# Patient Record
Sex: Female | Born: 1985 | Race: Black or African American | Hispanic: No | Marital: Married | Smoking: Current every day smoker
Health system: Southern US, Community
[De-identification: ages and names within clinical notes are randomized; demographics above are authoritative.]

---

## 2016-09-23 ENCOUNTER — Encounter (HOSPITAL_COMMUNITY): Payer: Self-pay | Admitting: *Deleted

## 2016-09-23 DIAGNOSIS — F17200 Nicotine dependence, unspecified, uncomplicated: Secondary | ICD-10-CM | POA: Insufficient documentation

## 2016-09-23 DIAGNOSIS — M791 Myalgia: Secondary | ICD-10-CM | POA: Insufficient documentation

## 2016-09-23 DIAGNOSIS — M25552 Pain in left hip: Secondary | ICD-10-CM | POA: Insufficient documentation

## 2016-09-23 DIAGNOSIS — R0789 Other chest pain: Secondary | ICD-10-CM | POA: Insufficient documentation

## 2016-09-23 NOTE — ED Triage Notes (Signed)
The pt is c/o pain in her entire lt side of her body for approx one-two weeks  She reports that it comes and goes  Worse in her lt hip and the pain shoots down to her lt toes  lmp this month

## 2016-09-24 ENCOUNTER — Emergency Department (HOSPITAL_COMMUNITY)
Admission: EM | Admit: 2016-09-24 | Discharge: 2016-09-24 | Disposition: A | Discharge status: Home / Self Care | Payer: Self-pay | Attending: Emergency Medicine | Admitting: Emergency Medicine

## 2016-09-24 ENCOUNTER — Emergency Department (HOSPITAL_COMMUNITY): Payer: Self-pay

## 2016-09-24 DIAGNOSIS — M791 Myalgia, unspecified site: Secondary | ICD-10-CM

## 2016-09-24 DIAGNOSIS — M25552 Pain in left hip: Secondary | ICD-10-CM

## 2016-09-24 DIAGNOSIS — R0789 Other chest pain: Secondary | ICD-10-CM

## 2016-09-24 LAB — CBC WITH DIFFERENTIAL/PLATELET
BASOS ABS: 0 10*3/uL (ref 0.0–0.1)
BASOS PCT: 0 %
EOS ABS: 0.1 10*3/uL (ref 0.0–0.7)
Eosinophils Relative: 1 %
HEMATOCRIT: 37.3 % (ref 36.0–46.0)
HEMOGLOBIN: 12.4 g/dL (ref 12.0–15.0)
Lymphocytes Relative: 20 %
Lymphs Abs: 2.7 10*3/uL (ref 0.7–4.0)
MCH: 27.7 pg (ref 26.0–34.0)
MCHC: 33.2 g/dL (ref 30.0–36.0)
MCV: 83.3 fL (ref 78.0–100.0)
MONOS PCT: 4 %
Monocytes Absolute: 0.5 10*3/uL (ref 0.1–1.0)
NEUTROS ABS: 9.9 10*3/uL — AB (ref 1.7–7.7)
NEUTROS PCT: 75 %
Platelets: 280 10*3/uL (ref 150–400)
RBC: 4.48 MIL/uL (ref 3.87–5.11)
RDW: 13.6 % (ref 11.5–15.5)
WBC: 13.2 10*3/uL — ABNORMAL HIGH (ref 4.0–10.5)

## 2016-09-24 LAB — URINALYSIS, ROUTINE W REFLEX MICROSCOPIC
Glucose, UA: NEGATIVE mg/dL
KETONES UR: 40 mg/dL — AB
LEUKOCYTES UA: NEGATIVE
NITRITE: NEGATIVE
PH: 5.5 (ref 5.0–8.0)
Protein, ur: 30 mg/dL — AB
Specific Gravity, Urine: 1.03 (ref 1.005–1.030)

## 2016-09-24 LAB — URINE MICROSCOPIC-ADD ON

## 2016-09-24 LAB — BASIC METABOLIC PANEL
ANION GAP: 11 (ref 5–15)
BUN: 5 mg/dL — ABNORMAL LOW (ref 6–20)
CALCIUM: 9.2 mg/dL (ref 8.9–10.3)
CO2: 23 mmol/L (ref 22–32)
CREATININE: 0.93 mg/dL (ref 0.44–1.00)
Chloride: 104 mmol/L (ref 101–111)
Glucose, Bld: 90 mg/dL (ref 65–99)
Potassium: 3.5 mmol/L (ref 3.5–5.1)
SODIUM: 138 mmol/L (ref 135–145)

## 2016-09-24 LAB — CK: CK TOTAL: 207 U/L (ref 38–234)

## 2016-09-24 LAB — POC URINE PREG, ED: PREG TEST UR: NEGATIVE

## 2016-09-24 MED ORDER — IBUPROFEN 600 MG PO TABS: 600.0000 mg | ORAL_TABLET | Freq: Four times a day (QID) | ORAL | 0 refills | Status: AC | PRN

## 2016-09-24 MED ORDER — IBUPROFEN 400 MG PO TABS
600.0000 mg | ORAL_TABLET | Freq: Once | ORAL | Status: AC
  Administered 2016-09-24: 600 mg via ORAL
  Filled 2016-09-24: qty 1

## 2016-09-24 NOTE — ED Notes (Signed)
Pt aware of need for urine sample. Pt unable to void at this time.  

## 2016-09-24 NOTE — ED Notes (Signed)
Urine sample located in mini-lab and sent down for urinalysis.

## 2016-09-24 NOTE — ED Provider Notes (Signed)
[facility]-EMERGENCY DEPT Provider Note   CSN: [id number] Arrival date & time: 09/23/16  2312  By signing my name below, I, Kayla Kim, attest that this documentation has been prepared under the direction and in the presence of Zadie Rhineonald Corlis Angelica, MD. Electronically Signed: Ether GriffinsKayla Kim, ED Scribe. 09/24/16. 2:29 AM.    History   Chief Complaint Chief Complaint  Patient presents with  . Generalized Body Aches     The history is provided by the patient. No language interpreter was used.  Illness  This is a new problem. The current episode started more than 1 week ago. The problem occurs constantly. The problem has not changed since onset.Associated symptoms include chest pain and abdominal pain. Pertinent negatives include no headaches and no shortness of breath. The symptoms are aggravated by walking and standing. Nothing relieves the symptoms. She has tried nothing for the symptoms. The treatment provided no relief.    HPI Comments: Kayla Kim is a 30 y.o. female otherwise healthy who presents to the Emergency Department complaining of constant shooting, aching left-sided body pain that started 1.5 weeks ago. Pt states that the pain is worst in her left hip and shoots down to her toes. Pt denies recent falls, trauma or injury. No recent long travel or surgery. Pt states pain worsens with prolonged periods in one position and with weight bearing. She also c/o left sided neck pain which is worsened by arm movement, left sided chest pains, lower back pain and abdominal pain. Pt denies fever, vomiting, headache, vision loss.   PMH - none Denies recent travel OB History    No data available       Home Medications    Prior to Admission medications   Not on File    Family History No family history on file.  Social History Social History  Substance Use Topics  . Smoking status: Current Every Day Smoker  . Smokeless tobacco: Never Used  . Alcohol use Yes      Allergies   Flagyl [metronidazole]   Review of Systems Review of Systems  Constitutional: Negative for fever.  Eyes: Negative for visual disturbance.  Respiratory: Negative for shortness of breath.        Denies hemoptysis   Cardiovascular: Positive for chest pain.  Gastrointestinal: Positive for abdominal pain. Negative for vomiting.  Musculoskeletal: Positive for arthralgias, back pain and myalgias.  Neurological: Negative for headaches.  All other systems reviewed and are negative.    Physical Exam Updated Vital Signs BP 108/73 (BP Location: Right Arm)   Pulse 91   Temp 97.9 F (36.6 C) (Oral)   Resp 23   Ht 5\' 1"  (1.549 m)   Wt 125 lb (56.7 kg)   LMP 09/15/2016   SpO2 100%   BMI 23.62 kg/m   Physical Exam  CONSTITUTIONAL: Well developed/well nourished HEAD: Normocephalic/atraumatic EYES: EOMI/PERRL ENMT: Mucous membranes moist NECK: supple no meningeal signs SPINE/BACK:entire spine nontender CV: S1/S2 noted, no murmurs/rubs/gallops noted, Chest - diffuse left side of chest wall tenderness, no crepitus noted LUNGS: Lungs are clear to auscultation bilaterally, no apparent distress ABDOMEN: soft, nontender, no rebound or guarding, bowel sounds noted throughout abdomen GU:no cva tenderness NEURO: Pt is awake/alert/appropriate, moves all extremitiesx4.  No facial droop.  No arm or leg drift, no ataxia noted.  Antalgic gait noted.    EXTREMITIES: pulses normal/equal, full ROM, tenderness to range of motion of left hip, no deformities noted.  No LE edema or erythema.  Distal pulses equal/intact.   SKIN:  warm, color normal PSYCH: no abnormalities of mood noted, alert and oriented to situation ED Treatments / Results  DIAGNOSTIC STUDIES: Oxygen Saturation is 100% on RA, normal by my interpretation.    COORDINATION OF CARE: 2:29 AM Discussed treatment plan with pt at bedside and pt agreed to plan.  Labs (all labs ordered are listed, but only abnormal  results are displayed) Labs Reviewed  BASIC METABOLIC PANEL - Abnormal; Notable for the following:       Result Value   BUN 5 (*)    All other components within normal limits  CBC WITH DIFFERENTIAL/PLATELET - Abnormal; Notable for the following:    WBC 13.2 (*)    Neutro Abs 9.9 (*)    All other components within normal limits  URINALYSIS, ROUTINE W REFLEX MICROSCOPIC ([facility]) - Abnormal; Notable for the following:    Color, Urine AMBER (*)    APPearance CLOUDY (*)    Hgb urine dipstick TRACE (*)    Bilirubin Urine SMALL (*)    Ketones, ur 40 (*)    Protein, ur 30 (*)    All other components within normal limits  URINE MICROSCOPIC-ADD ON - Abnormal; Notable for the following:    Squamous Epithelial / LPF 6-30 (*)    Bacteria, UA RARE (*)    All other components within normal limits  CK  POC URINE PREG, ED    EKG  EKG Interpretation  Date/Time:  Saturday September 23 2016 23:25:22 EST Ventricular Rate:  106 PR Interval:  120 QRS Duration: 86 QT Interval:  304 QTC Calculation: 403 R Axis:   81 Text Interpretation:  Sinus tachycardia Biatrial enlargement T wave abnormality, consider inferior ischemia Abnormal ECG No previous ECGs available Confirmed by Bebe ShaggyWICKLINE  MD, Aura Bibby ([id number]) on 09/24/2016 1:33:11 AM       Radiology Dg Chest 2 View  Result Date: 09/24/2016 CLINICAL DATA:  Left-sided body pain intermittent over the past 2 weeks. EXAM: CHEST  2 VIEW COMPARISON:  None. FINDINGS: The lungs are clear. The pulmonary vasculature is normal. Heart size is normal. Hilar and mediastinal contours are unremarkable. There is no pleural effusion. IMPRESSION: No active cardiopulmonary disease. Electronically Signed   By: Ellery Plunkaniel R Mitchell M.D.   On: 09/24/2016 04:21   Dg Hip Unilat W Or Wo Pelvis 2-3 Views Left  Result Date: 09/24/2016 CLINICAL DATA:  Left hip and leg pain EXAM: DG HIP (WITH OR WITHOUT PELVIS) 2-3V LEFT COMPARISON:  None. FINDINGS: There is no evidence of hip  fracture or dislocation. There is no evidence of arthropathy or other focal bone abnormality. IMPRESSION: Negative. Electronically Signed   By: Ellery Plunkaniel R Mitchell M.D.   On: 09/24/2016 04:21    Procedures Procedures (including critical care time)  Medications Ordered in ED Medications  ibuprofen (ADVIL,MOTRIN) tablet 600 mg (600 mg Oral Given 09/24/16 0245)     Initial Impression / Assessment and Plan / ED Course  I have reviewed the triage vital signs and the nursing notes.  Pertinent labs & imaging results that were available during my care of the patient were reviewed by me and considered in my medical decision making (see chart for details).  Clinical Course     Pt here with multiple complaints, she reported pain started in left hip region and now moved into upper body On exam, most of her pain focused on palpation of chest wall and left hip Imaging negative Pt ambulatory She did have unusual appearing EKG, though no old to compare.  She had no hypoxia/SOB or significant tachycardia to suggest PE (she also had reproducible CP) Otherwise she slept in the ER for hours without any acute issue I feel she is appropriate for d/c home Will start NSAIDs She will need PCP evaluation   Final Clinical Impressions(s) / ED Diagnoses   Final diagnoses:  Myalgia  Chest wall pain  Left hip pain  Pain of left hip joint    New Prescriptions New Prescriptions   IBUPROFEN (ADVIL,MOTRIN) 600 MG TABLET    Take 1 tablet (600 mg total) by mouth every 6 (six) hours as needed.    I personally performed the services described in this documentation, which was scribed in my presence. The recorded information has been reviewed and is accurate.        Zadie Rhine, MD 09/24/16 [phone]

## 2017-12-22 IMAGING — CR DG CHEST 2V
2 series · 2 of 2 positions shown · non-contrast
Comparison: None.

CLINICAL DATA: Left-sided body pain intermittent over the past 2
weeks.

EXAM:
CHEST  2 VIEW

[chest lat]
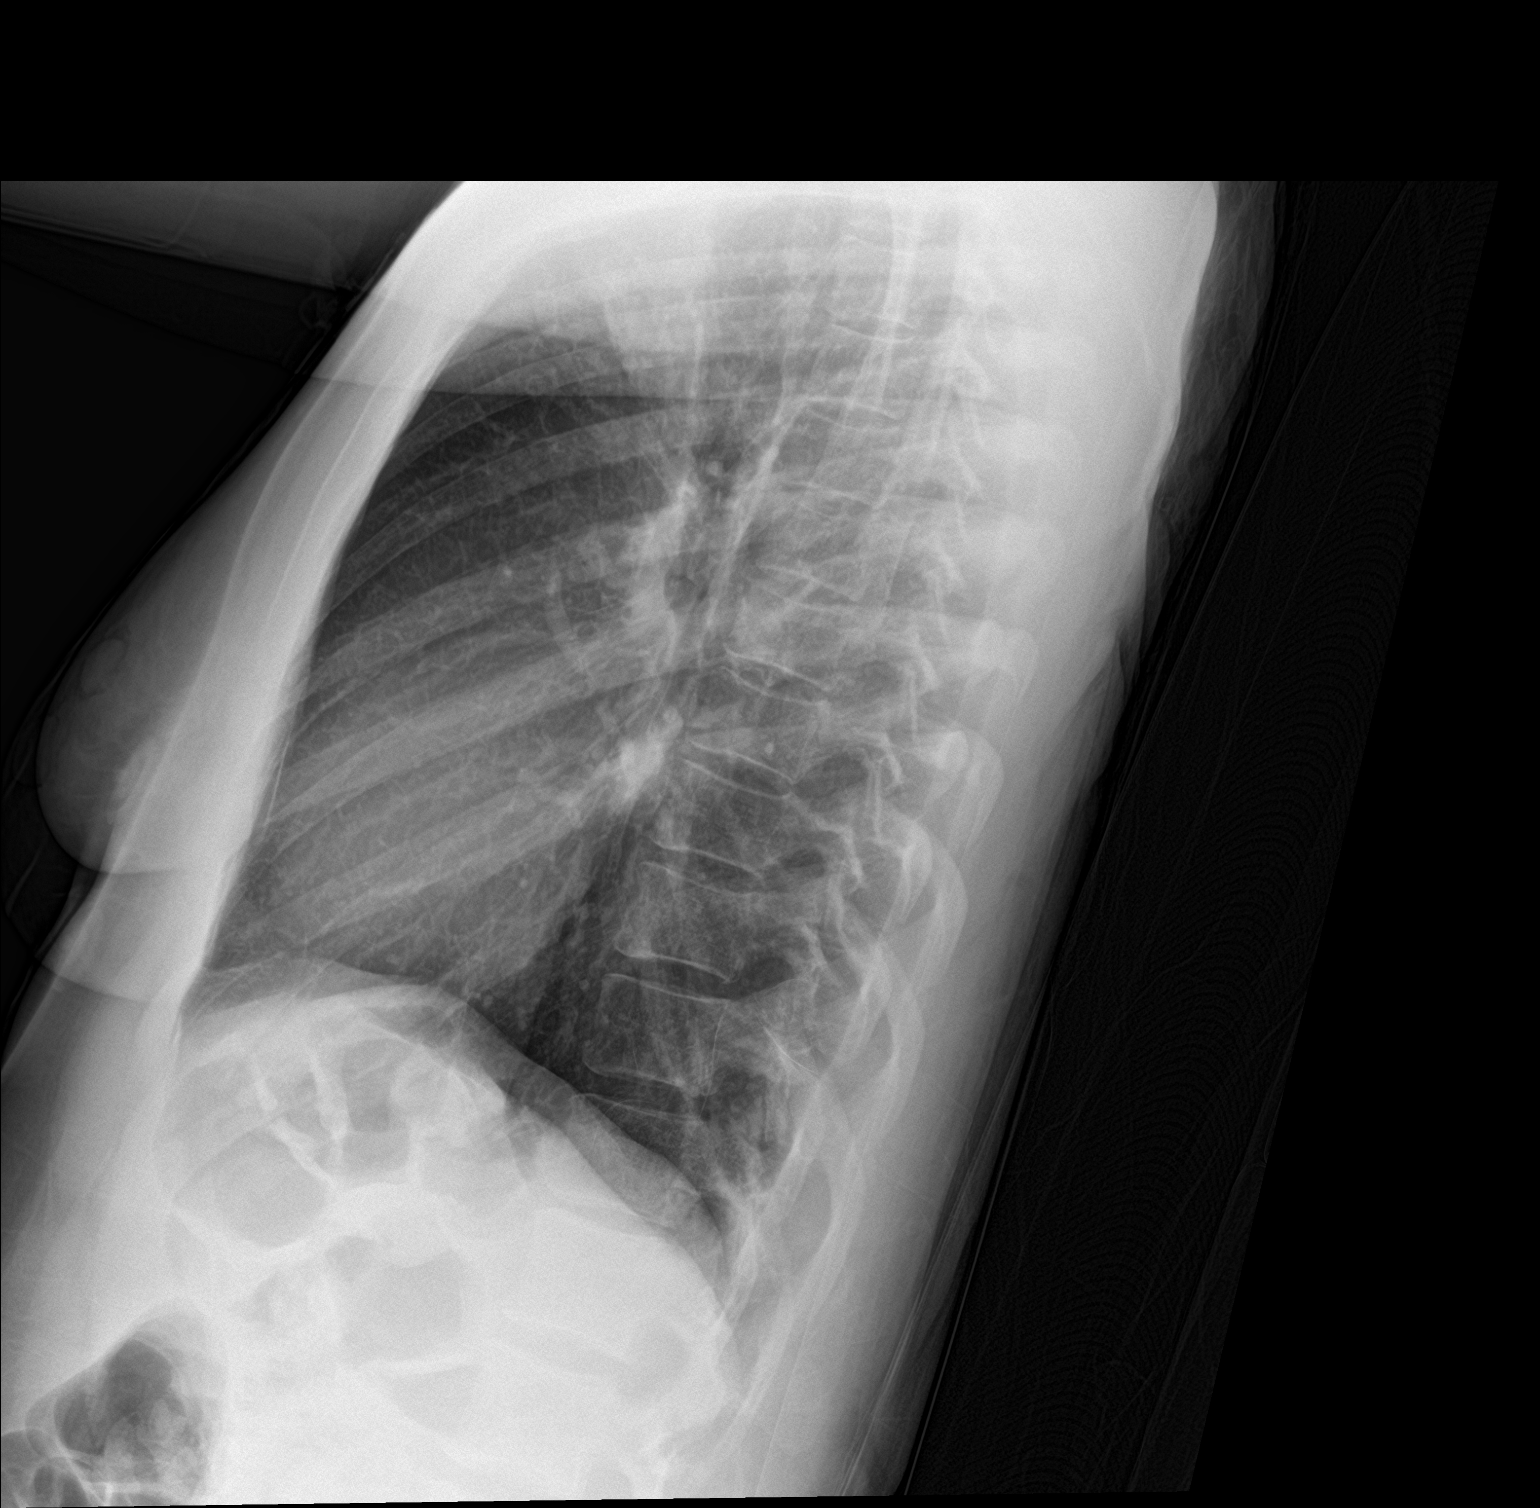

[chest ap]
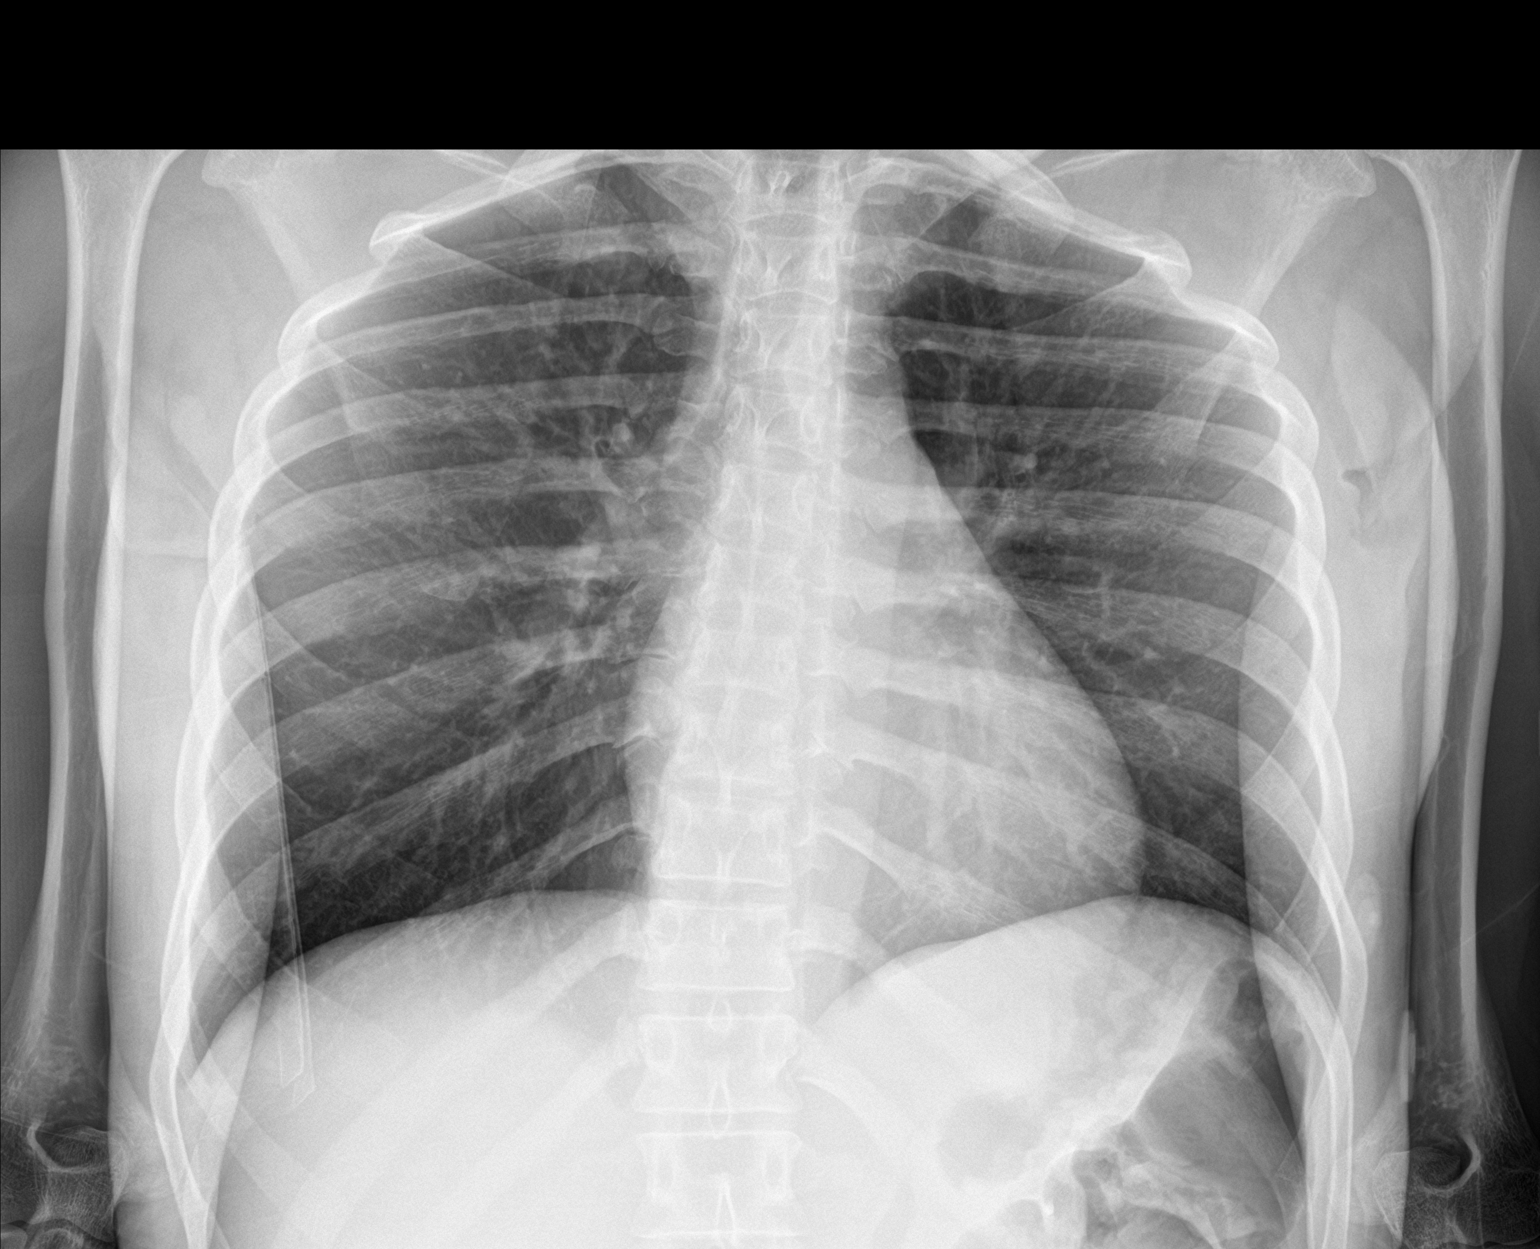

[2 of 2 positions shown; findings below may reference images not displayed]

FINDINGS: The lungs are clear. The pulmonary vasculature is normal. Heart size
is normal. Hilar and mediastinal contours are unremarkable. There is
no pleural effusion.
IMPRESSION: No active cardiopulmonary disease.
